# Patient Record
Sex: Male | Born: 1960 | Race: White | Hispanic: No | Marital: Single | Smoking: Current every day smoker
Health system: Southern US, Community
[De-identification: ages and names within clinical notes are randomized; demographics above are authoritative.]

---

## 2006-05-29 ENCOUNTER — Emergency Department: Payer: Self-pay | Admitting: Emergency Medicine

## 2019-04-28 ENCOUNTER — Emergency Department
Admission: EM | Admit: 2019-04-28 | Discharge: 2019-04-28 | Disposition: A | Discharge status: Home / Self Care | Payer: Self-pay | Attending: Emergency Medicine | Admitting: Emergency Medicine

## 2019-04-28 ENCOUNTER — Emergency Department: Payer: Self-pay

## 2019-04-28 ENCOUNTER — Encounter: Payer: Self-pay | Admitting: Emergency Medicine

## 2019-04-28 ENCOUNTER — Other Ambulatory Visit: Payer: Self-pay

## 2019-04-28 DIAGNOSIS — K219 Gastro-esophageal reflux disease without esophagitis: Secondary | ICD-10-CM | POA: Insufficient documentation

## 2019-04-28 DIAGNOSIS — R0789 Other chest pain: Secondary | ICD-10-CM | POA: Insufficient documentation

## 2019-04-28 DIAGNOSIS — F17210 Nicotine dependence, cigarettes, uncomplicated: Secondary | ICD-10-CM | POA: Insufficient documentation

## 2019-04-28 DIAGNOSIS — K2100 Gastro-esophageal reflux disease with esophagitis, without bleeding: Secondary | ICD-10-CM

## 2019-04-28 LAB — BASIC METABOLIC PANEL WITH GFR
Anion gap: 10 (ref 5–15)
BUN: 16 mg/dL (ref 6–20)
CO2: 29 mmol/L (ref 22–32)
Calcium: 9.9 mg/dL (ref 8.9–10.3)
Chloride: 101 mmol/L (ref 98–111)
Creatinine, Ser: 0.81 mg/dL (ref 0.61–1.24)
GFR calc Af Amer: >60 mL/min
GFR calc non Af Amer: >60 mL/min
Glucose, Bld: 105 mg/dL — ABNORMAL HIGH (ref 70–99)
Potassium: 4.5 mmol/L (ref 3.5–5.1)
Sodium: 140 mmol/L (ref 135–145)

## 2019-04-28 LAB — CBC
HCT: 51.8 % (ref 39.0–52.0)
Hemoglobin: 17.7 g/dL — ABNORMAL HIGH (ref 13.0–17.0)
MCH: 31.3 pg (ref 26.0–34.0)
MCHC: 34.2 g/dL (ref 30.0–36.0)
MCV: 91.7 fL (ref 80.0–100.0)
Platelets: 190 K/uL (ref 150–400)
RBC: 5.65 MIL/uL (ref 4.22–5.81)
RDW: 13 % (ref 11.5–15.5)
WBC: 8.8 K/uL (ref 4.0–10.5)
nRBC: 0 % (ref 0.0–0.2)

## 2019-04-28 LAB — TROPONIN I (HIGH SENSITIVITY)
Troponin I (High Sensitivity): 4 ng/L
Troponin I (High Sensitivity): 5 ng/L (ref <18)

## 2019-04-28 MED ORDER — OMEPRAZOLE 20 MG PO CPDR: 20.0000 mg | DELAYED_RELEASE_CAPSULE | Freq: Every day | ORAL | 1 refills | Status: AC

## 2019-04-28 MED ORDER — ALUM & MAG HYDROXIDE-SIMETH 200-200-20 MG/5ML PO SUSP
15.0000 mL | Freq: Once | ORAL | Status: AC
  Administered 2019-04-28: 15 mL via ORAL
  Filled 2019-04-28: qty 30

## 2019-04-28 MED ORDER — LIDOCAINE VISCOUS HCL 2 % MT SOLN
15.0000 mL | Freq: Once | OROMUCOSAL | Status: AC
  Administered 2019-04-28: 15 mL via ORAL
  Filled 2019-04-28: qty 15

## 2019-04-28 NOTE — ED Triage Notes (Signed)
Pt in via POV, with complaints of generalized chest pain x 3-4 pain, denies any accompanying symptoms.  NAD noted at this time.

## 2019-04-28 NOTE — ED Notes (Signed)
ED Provider at bedside. 

## 2019-04-28 NOTE — ED Provider Notes (Signed)
[facility] Emergency Department Provider Note   ____________________________________________   First MD Initiated Contact with Patient 04/28/19 1314     (approximate)  I have reviewed the triage vital signs and the nursing notes.   HISTORY  Chief Complaint Chest Pain    HPI Patrick Daniels is a 59 y.o. male with no significant past medical history who presents to the ED complaining of chest pain.  Patient reports that he has had intermittent pain in the center of his chest for the past couple of months.  Pain seems to start in his lower chest and move upwards and to the sides.  He describes it as a deep aching that seems to be worse when he goes to lay flat on his back at night.  He has not had any associated fevers, cough, shortness of breath, leg swelling or pain.  He does report a history of heartburn that had improved years ago when he reduced his alcohol consumption and started eating meals earlier in the evening.  He does continue to smoke about 1 pack/day cigarettes.        History reviewed. No pertinent past medical history.  There are no problems to display for this patient.   History reviewed. No pertinent surgical history.  Prior to Admission medications   Medication Sig Start Date End Date Taking? Authorizing Provider  omeprazole (PRILOSEC) 20 MG capsule Take 1 capsule (20 mg total) by mouth daily. 04/28/19 04/27/20  Chesley Noon, MD    Allergies Patient has no known allergies.  No family history on file.  Social History Social History   Tobacco Use  . Smoking status: Current Every Day Smoker    Packs/day: 1.00    Types: Cigarettes  . Smokeless tobacco: Never Used  Substance Use Topics  . Alcohol use: Not Currently  . Drug use: Not Currently    Review of Systems  Constitutional: No fever/chills Eyes: No visual changes. ENT: No sore throat. Cardiovascular: Positive for chest pain. Respiratory: Denies shortness of  breath. Gastrointestinal: No abdominal pain.  No nausea, no vomiting.  No diarrhea.  No constipation. Genitourinary: Negative for dysuria. Musculoskeletal: Negative for back pain. Skin: Negative for rash. Neurological: Negative for headaches, focal weakness or numbness.  ____________________________________________   PHYSICAL EXAM:  VITAL SIGNS: ED Triage Vitals  Enc Vitals Group     BP 04/28/19 1227 (!) 167/94     Pulse Rate 04/28/19 1227 96     Resp 04/28/19 1227 16     Temp 04/28/19 1227 98.6 F (37 C)     Temp Source 04/28/19 1227 Oral     SpO2 04/28/19 1227 98 %     Weight 04/28/19 1223 180 lb (81.6 kg)     Height 04/28/19 1223 5\' 8"  (1.727 m)     Head Circumference --      Peak Flow --      Pain Score 04/28/19 1222 4     Pain Loc --      Pain Edu? --      Excl. in GC? --     Constitutional: Alert and oriented. Eyes: Conjunctivae are normal. Head: Atraumatic. Nose: No congestion/rhinnorhea. Mouth/Throat: Mucous membranes are moist. Neck: Normal ROM Cardiovascular: Normal rate, regular rhythm. Grossly normal heart sounds. Respiratory: Normal respiratory effort.  No retractions. Lungs CTAB. Gastrointestinal: Soft and nontender. No distention. Genitourinary: deferred Musculoskeletal: No lower extremity tenderness nor edema. Neurologic:  Normal speech and language. No gross focal neurologic deficits are appreciated. Skin:  Skin is warm, dry and intact. No rash noted. Psychiatric: Mood and affect are normal. Speech and behavior are normal.  ____________________________________________   LABS (all labs ordered are listed, but only abnormal results are displayed)  Labs Reviewed  BASIC METABOLIC PANEL - Abnormal; Notable for the following components:      Result Value   Glucose, Bld 105 (*)    All other components within normal limits  CBC - Abnormal; Notable for the following components:   Hemoglobin 17.7 (*)    All other components within normal limits   TROPONIN I (HIGH SENSITIVITY)  TROPONIN I (HIGH SENSITIVITY)   ____________________________________________  EKG  ED ECG REPORT I, Blake Divine, the attending physician, personally viewed and interpreted this ECG.   Date: 04/28/2019  EKG Time: 12:21  Rate: 96  Rhythm: normal sinus rhythm  Axis: Normal  Intervals:none  ST&T Change: None   PROCEDURES  Procedure(s) performed (including Critical Care):  Procedures   ____________________________________________   INITIAL IMPRESSION / ASSESSMENT AND PLAN / ED COURSE       59 year old male with history of 1 pack/day smoking presents to the ED with intermittent chest pain over the past few weeks that is described as a throbbing moving upward and outward in his chest.  Symptoms sound most consistent with GERD given the upward movement and worse when he lays flat.  Initial work-up is negative, EKG without evidence of arrhythmia or ischemia, and troponin negative.  Remainder of labs are reassuring, chest x-ray negative for acute process.  We will treat with GI cocktail and trend enzymes, if these remain negative he would be appropriate for discharge home given his heart score of less than 4.  Repeat troponin within normal limits and patient reports chest pain is improved following GI cocktail.  We will prescribe PPI for suspected GERD and counseled patient to follow-up with his PCP, otherwise return to the ED for new or worsening symptoms.  Patient agrees with plan.      ____________________________________________   FINAL CLINICAL IMPRESSION(S) / ED DIAGNOSES  Final diagnoses:  Atypical chest pain  Gastroesophageal reflux disease with esophagitis without hemorrhage     ED Discharge Orders         Ordered    omeprazole (PRILOSEC) 20 MG capsule  Daily     04/28/19 1529           Note:  This document was prepared using Dragon voice recognition software and may include unintentional dictation errors.   Blake Divine, MD 04/28/19 1530

## 2021-11-16 IMAGING — CR DG CHEST 2V
1 series · 2 of 2 positions shown · non-contrast
Comparison: None

CLINICAL DATA: Generalized chest pain for 3-4 weeks, smoker

EXAM:
CHEST - 2 VIEW

[Series 1: dg chest 2 view · 0.14mm/px · 2 of 2 slices shown]
[im 1/2]
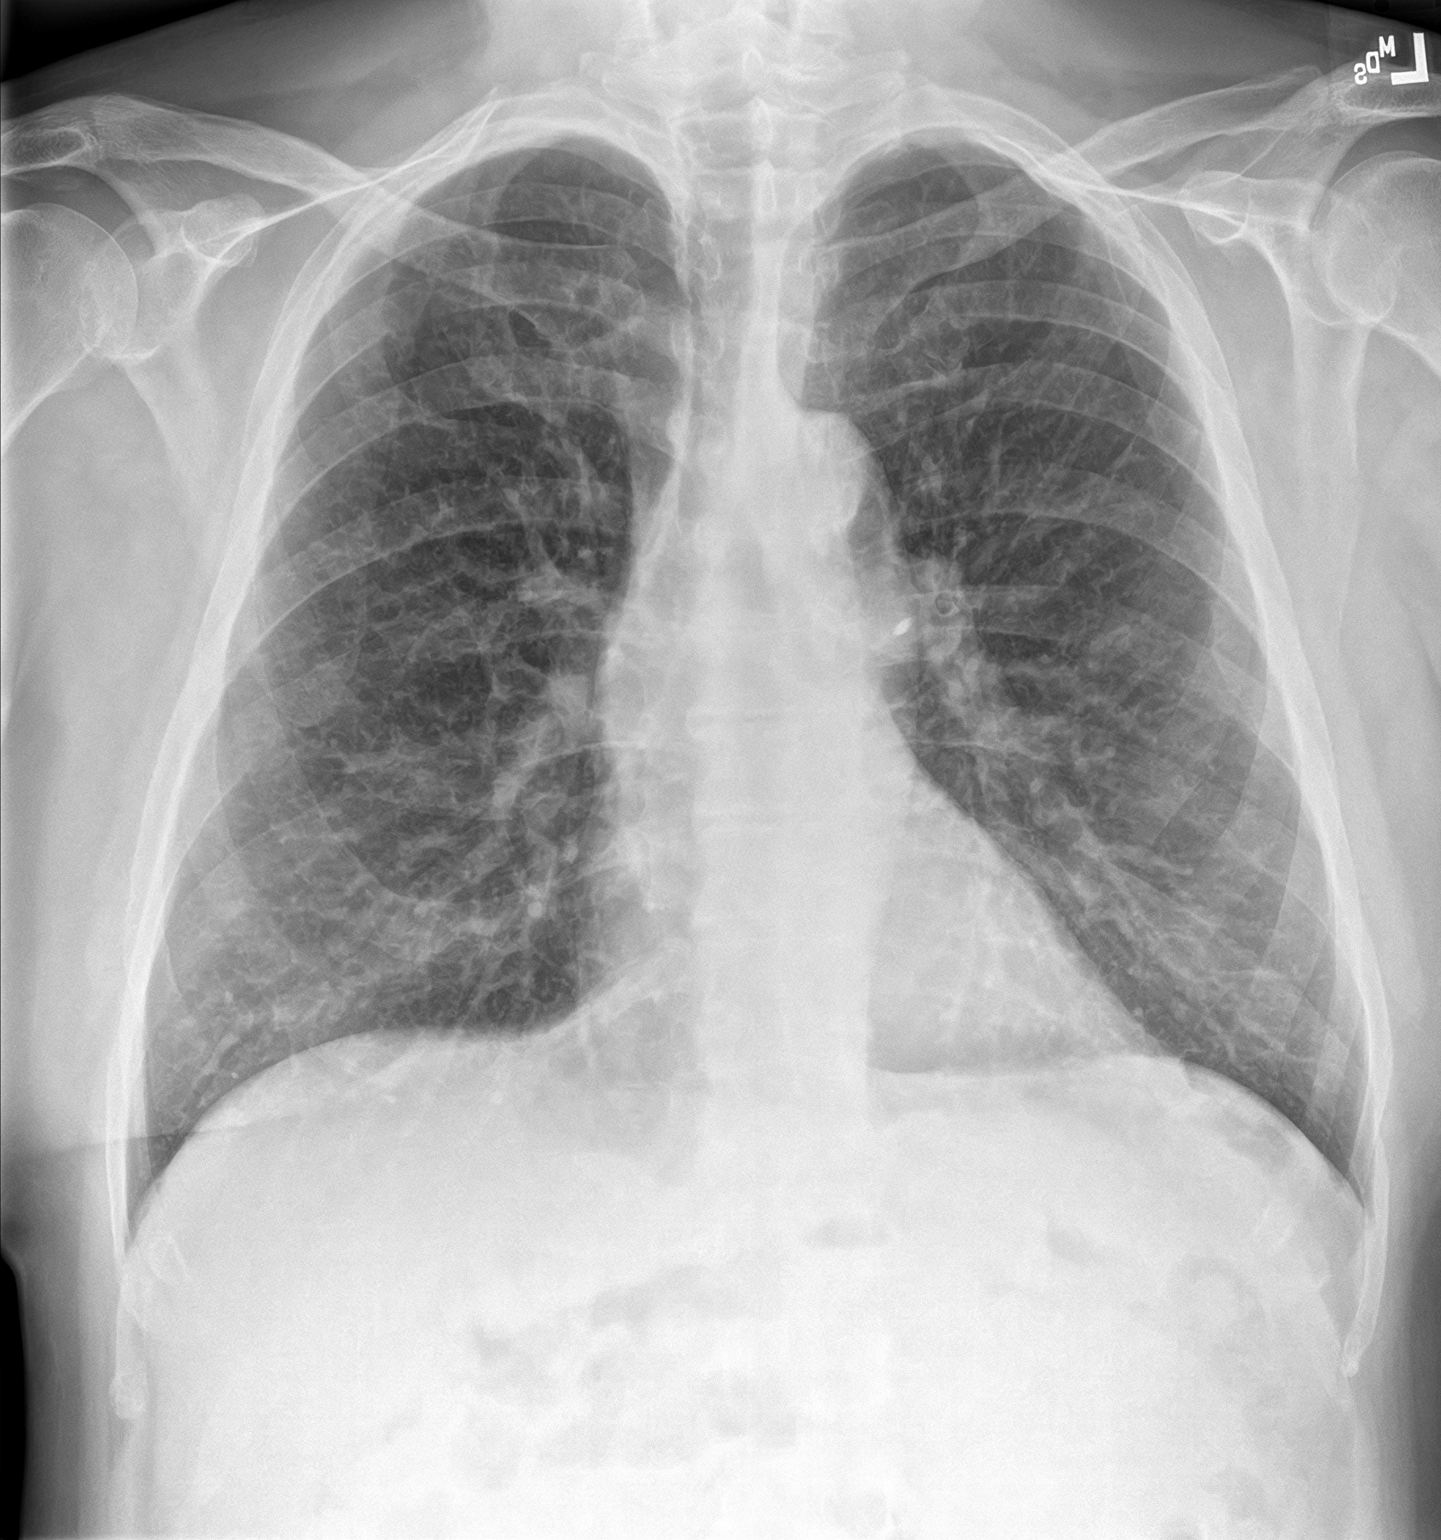
[im 2/2]
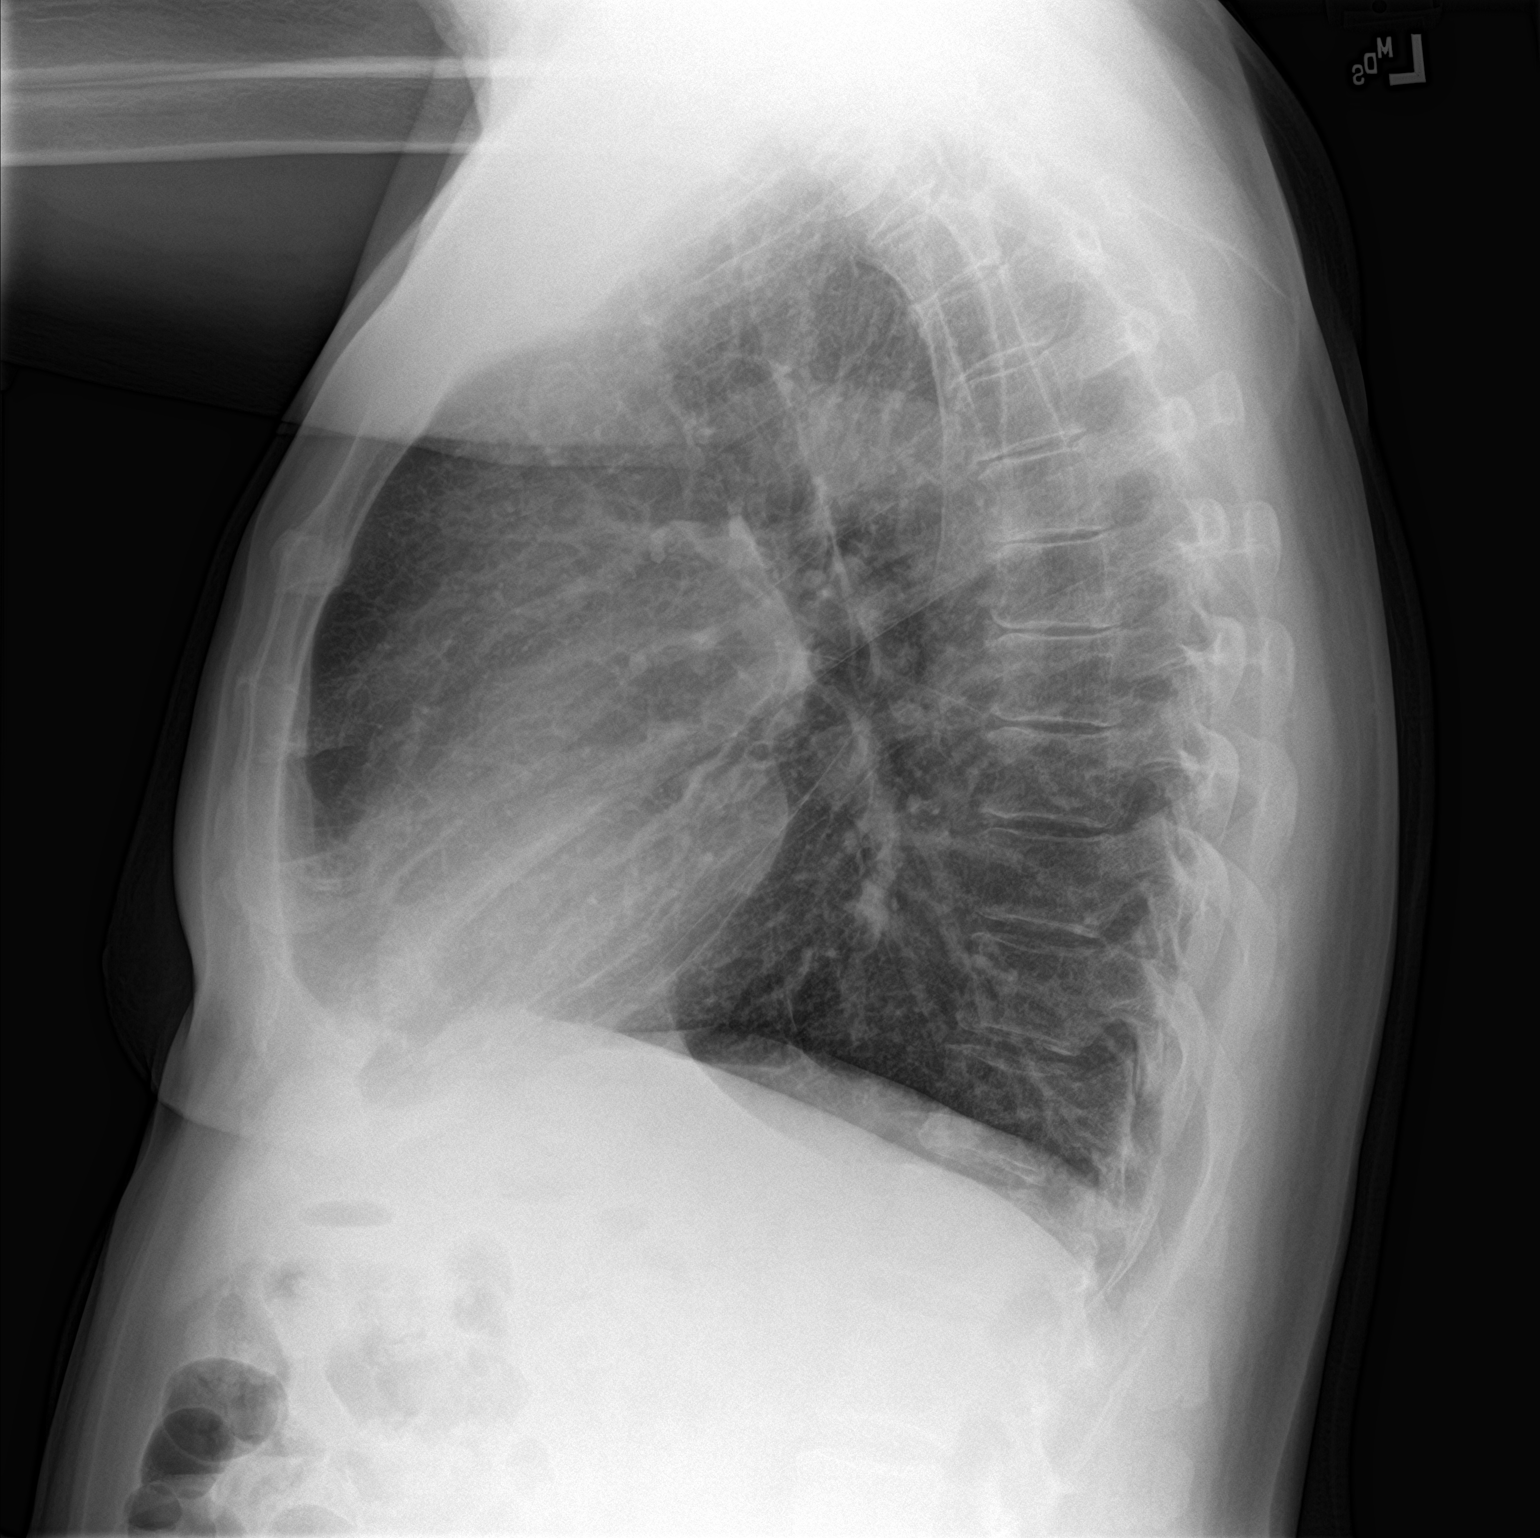

[2 of 2 positions shown; findings below may reference images not displayed]

FINDINGS: Normal heart size, mediastinal contours, and pulmonary vascularity.

Atherosclerotic calcification aorta.

Peribronchial thickening and accentuation of perihilar interstitial
markings consistent with bronchitis.

Minimal subsegmental atelectasis at lung bases.

No definite infiltrate, pleural effusion or pneumothorax.

Osseous structures unremarkable.
IMPRESSION: Bronchitic changes with minimal bibasilar atelectasis.

No acute infiltrate.

## 2022-10-07 ENCOUNTER — Ambulatory Visit: Payer: Medicaid Other

## 2022-10-07 DIAGNOSIS — K641 Second degree hemorrhoids: Secondary | ICD-10-CM

## 2022-10-07 DIAGNOSIS — K219 Gastro-esophageal reflux disease without esophagitis: Secondary | ICD-10-CM

## 2022-10-07 DIAGNOSIS — K3189 Other diseases of stomach and duodenum: Secondary | ICD-10-CM

## 2022-10-07 DIAGNOSIS — R0789 Other chest pain: Secondary | ICD-10-CM

## 2022-10-07 DIAGNOSIS — K2970 Gastritis, unspecified, without bleeding: Secondary | ICD-10-CM

## 2022-10-07 DIAGNOSIS — Z1211 Encounter for screening for malignant neoplasm of colon: Secondary | ICD-10-CM

## 2022-10-07 DIAGNOSIS — D125 Benign neoplasm of sigmoid colon: Secondary | ICD-10-CM

## 2023-12-30 ENCOUNTER — Ambulatory Visit
Admission: EM | Admit: 2023-12-30 | Discharge: 2023-12-30 | Disposition: A | Discharge status: Home / Self Care | Attending: Emergency Medicine | Admitting: Emergency Medicine

## 2023-12-30 DIAGNOSIS — L089 Local infection of the skin and subcutaneous tissue, unspecified: Secondary | ICD-10-CM

## 2023-12-30 DIAGNOSIS — S80212A Abrasion, left knee, initial encounter: Secondary | ICD-10-CM | POA: Diagnosis not present

## 2023-12-30 MED ORDER — DOXYCYCLINE HYCLATE 100 MG PO CAPS: 100.0000 mg | ORAL_CAPSULE | Freq: Two times a day (BID) | ORAL | 0 refills | Status: AC

## 2023-12-30 NOTE — Discharge Instructions (Addendum)
 Today you are evaluated for the abrasion to your knee which should have healed but now they are free we started on antibiotics  Take doxycycline for 7 days  Continue cleaning every morning with unscented soap and water, pat do not rub, may cover with a nonstick dressing  If symptoms continue to persist please follow-up for reevaluation

## 2023-12-30 NOTE — ED Provider Notes (Signed)
 Patrick Daniels    CSN: [id number] Arrival date & time: 12/30/23  9075      History   Chief Complaint Chief Complaint  Patient presents with   Abrasion    HPI Patrick Daniels is a 63 y.o. male.   Patient presents for evaluation for a wound check for an abrasion to the left knee that occurred due to carpet.  Started with a blister that spontaneously ruptured, initially cleaned with peroxide, been doing daily cleansing with soap and water and covering with a nonadherent dressing.  Denies pain swelling or drainage but area has not fully healed.   History reviewed. No pertinent past medical history.  There are no active problems to display for this patient.   History reviewed. No pertinent surgical history.     Home Medications    Prior to Admission medications   Medication Sig Start Date End Date Taking? Authorizing Provider  doxycycline (VIBRAMYCIN) 100 MG capsule Take 1 capsule (100 mg total) by mouth 2 (two) times daily. 12/30/23  Yes Shakiera Edelson R, NP  omeprazole  (PRILOSEC) 20 MG capsule Take 1 capsule (20 mg total) by mouth daily. 04/28/19 04/27/20  Willo Dunnings, MD    Family History History reviewed. No pertinent family history.  Social History Social History   Tobacco Use   Smoking status: Every Day    Current packs/day: 1.00    Types: Cigarettes   Smokeless tobacco: Never  Vaping Use   Vaping status: Never Used  Substance Use Topics   Alcohol use: Not Currently   Drug use: Not Currently     Allergies   Patient has no known allergies.   Review of Systems Review of Systems   Physical Exam Triage Vital Signs ED Triage Vitals  Encounter Vitals Group     BP 12/30/23 1036 138/82     Girls Systolic BP Percentile --      Girls Diastolic BP Percentile --      Boys Systolic BP Percentile --      Boys Diastolic BP Percentile --      Pulse Rate 12/30/23 1036 91     Resp 12/30/23 1036 16     Temp 12/30/23 1036 97.7 F (36.5 C)      Temp Source 12/30/23 1036 Oral     SpO2 12/30/23 1036 95 %     Weight --      Height --      Head Circumference --      Peak Flow --      Pain Score 12/30/23 1041 3     Pain Loc --      Pain Education --      Exclude from Growth Chart --    No data found.  Updated Vital Signs BP 138/82 (BP Location: Left Arm)   Pulse 91   Temp 97.7 F (36.5 C) (Oral)   Resp 16   SpO2 95%   Visual Acuity Right Eye Distance:   Left Eye Distance:   Bilateral Distance:    Right Eye Near:   Left Eye Near:    Bilateral Near:     Physical Exam Constitutional:      Appearance: Normal appearance.  Eyes:     Extraocular Movements: Extraocular movements intact.  Pulmonary:     Effort: Pulmonary effort is normal.  Neurological:     Mental Status: He is alert and oriented to person, place, and time.      UC Treatments / Results  Labs (  all labs ordered are listed, but only abnormal results are displayed) Labs Reviewed - No data to display  EKG   Radiology No results found.  Procedures Procedures (including critical care time)  Medications Ordered in UC Medications - No data to display  Initial Impression / Assessment and Plan / UC Course  I have reviewed the triage vital signs and the nursing notes.  Pertinent labs & imaging results that were available during my care of the patient were reviewed by me and considered in my medical decision making (see chart for details).  Infected abrasion of left knee, initial encounter  Wound present to the left knee with surrounding erythema and scant yellow drainage noted on bandage, present for 6 days concerning for delayed healing therefore placed on antibiotics, prescribed doxycycline recommended daily cleansing cover with a nonstick dressing advised to monitor and to follow-up if no improvement with symptoms Final Clinical Impressions(s) / UC Diagnoses   Final diagnoses:  Infected abrasion of left knee, initial encounter      Discharge Instructions      Today you are evaluated for the abrasion to your knee which should have healed but now they are free we started on antibiotics  Take doxycycline for 7 days  Continue cleaning every morning with unscented soap and water, pat do not rub, may cover with a nonstick dressing  If symptoms continue to persist please follow-up for reevaluation   ED Prescriptions     Medication Sig Dispense Auth. Provider   doxycycline (VIBRAMYCIN) 100 MG capsule Take 1 capsule (100 mg total) by mouth 2 (two) times daily. 14 capsule Alakai Macbride, Shelba SAUNDERS, NP      PDMP not reviewed this encounter.   Teresa Shelba SAUNDERS, NP 12/30/23 1150

## 2023-12-30 NOTE — ED Triage Notes (Signed)
 Pt states he fell and hit his left knee a week ago and now has a scrap/carpet burn that is red and has yellow drainage.

## 2024-01-28 DIAGNOSIS — H353221 Exudative age-related macular degeneration, left eye, with active choroidal neovascularization: Secondary | ICD-10-CM | POA: Diagnosis not present
